# Patient Record
Sex: Male | Born: 1995 | Race: White | Hispanic: No | Marital: Single | Smoking: Never smoker
Health system: Southern US, Community
[De-identification: ages and names within clinical notes are randomized; demographics above are authoritative.]

## PROBLEM LIST (undated history)

## (undated) DIAGNOSIS — M26629 Arthralgia of temporomandibular joint, unspecified side: Secondary | ICD-10-CM

## (undated) DIAGNOSIS — B019 Varicella without complication: Secondary | ICD-10-CM

## (undated) DIAGNOSIS — L089 Local infection of the skin and subcutaneous tissue, unspecified: Secondary | ICD-10-CM

## (undated) DIAGNOSIS — B2790 Infectious mononucleosis, unspecified without complication: Secondary | ICD-10-CM

## (undated) DIAGNOSIS — R55 Syncope and collapse: Secondary | ICD-10-CM

## (undated) DIAGNOSIS — B354 Tinea corporis: Secondary | ICD-10-CM

## (undated) DIAGNOSIS — M25561 Pain in right knee: Secondary | ICD-10-CM

## (undated) DIAGNOSIS — B958 Unspecified staphylococcus as the cause of diseases classified elsewhere: Secondary | ICD-10-CM

## (undated) DIAGNOSIS — S060X0A Concussion without loss of consciousness, initial encounter: Secondary | ICD-10-CM

## (undated) DIAGNOSIS — J309 Allergic rhinitis, unspecified: Secondary | ICD-10-CM

## (undated) HISTORY — DX: Varicella without complication: B01.9

## (undated) HISTORY — DX: Arthralgia of temporomandibular joint, unspecified side: M26.629

## (undated) HISTORY — DX: Tinea corporis: B35.4

## (undated) HISTORY — DX: Concussion without loss of consciousness, initial encounter: S06.0X0A

## (undated) HISTORY — DX: Local infection of the skin and subcutaneous tissue, unspecified: B95.8

## (undated) HISTORY — DX: Allergic rhinitis, unspecified: J30.9

## (undated) HISTORY — DX: Local infection of the skin and subcutaneous tissue, unspecified: L08.9

## (undated) HISTORY — DX: Syncope and collapse: R55

## (undated) HISTORY — DX: Pain in right knee: M25.561

## (undated) HISTORY — DX: Infectious mononucleosis, unspecified without complication: B27.90

---
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
## 2015-02-18 ENCOUNTER — Encounter: Payer: Self-pay | Admitting: Family Medicine

## 2016-02-23 DIAGNOSIS — M25561 Pain in right knee: Secondary | ICD-10-CM

## 2016-02-23 HISTORY — DX: Pain in right knee: M25.561

## 2016-03-03 ENCOUNTER — Encounter: Payer: Self-pay | Admitting: Family Medicine
